# Patient Record
Sex: Male | Born: 1970 | Race: White | Hispanic: No | Marital: Single | Smoking: Never smoker
Health system: Southern US, Community
[De-identification: ages and names within clinical notes are randomized; demographics above are authoritative.]

---

## 1998-09-12 ENCOUNTER — Ambulatory Visit (HOSPITAL_COMMUNITY): Admission: RE | Admit: 1998-09-12 | Discharge: 1998-09-12 | Payer: Self-pay | Admitting: Emergency Medicine

## 2004-10-15 ENCOUNTER — Emergency Department (HOSPITAL_COMMUNITY): Admission: EM | Admit: 2004-10-15 | Discharge: 2004-10-15 | Payer: Self-pay | Admitting: Emergency Medicine

## 2015-10-09 ENCOUNTER — Ambulatory Visit (INDEPENDENT_AMBULATORY_CARE_PROVIDER_SITE_OTHER): Payer: BLUE CROSS/BLUE SHIELD | Admitting: Urgent Care

## 2015-10-09 VITALS — BP 128/80 | HR 60 | Temp 97.8°F | Resp 16 | Ht 72.0 in | Wt 185.0 lb

## 2015-10-09 DIAGNOSIS — S61210A Laceration without foreign body of right index finger without damage to nail, initial encounter: Secondary | ICD-10-CM

## 2015-10-09 DIAGNOSIS — S61219A Laceration without foreign body of unspecified finger without damage to nail, initial encounter: Secondary | ICD-10-CM

## 2015-10-09 NOTE — Progress Notes (Signed)
    MRN: [id number] DOB: June 13, 1970  Subjective:   James Fields is a [age] y.o. male presenting for chief complaint of Finger Injury  Patient was working on his car, withdrew his hand from under his car and cut his right index finger against the car. He cleaned his finger, placed steri strips and came straight to our clinic. He denies loss of ROM, numbness or tingling. His last TDAP was 2014.   Bartlomiej currently has no medications in their medication list. Also has No Known Allergies.  Chao  has no past medical history on file. Also  has no past surgical history on file.  Objective:   Vitals: BP 128/80 mmHg  Pulse 60  Temp(Src) 97.8 F (36.6 C) (Oral)  Resp 16  Ht 6' (1.829 m)  Wt 185 lb (83.915 kg)  BMI 25.08 kg/m2  SpO2 98%  Physical Exam  Constitutional: He is oriented to person, place, and time. He appears well-developed and well-nourished.  Cardiovascular: Normal rate.   Pulmonary/Chest: Effort normal.  Musculoskeletal:       Right hand: He exhibits tenderness (over wound) and laceration. He exhibits normal range of motion, no bony tenderness, normal two-point discrimination, normal capillary refill, no deformity and no swelling. Normal sensation noted. Normal strength noted.       Hands: Neurological: He is alert and oriented to person, place, and time.  Skin: Skin is warm and dry.   PROCEDURE NOTE: laceration repair Verbal consent obtained from patient.  Local anesthesia with 2cc Lidocaine 2% without epinephrine.  Wound explored for tendon, ligament damage. Wound scrubbed with soap and water and rinsed. Wound closed with #4 5-0 Prolene simple interrupted sutures.  Wound cleansed and dressed.  Assessment and Plan :   1. Laceration of finger, initial encounter - Laceration repaired, wound care reviewed. RTC in 7-10 days for suture removal.  Wallis BambergMario Kysha Muralles, PA-C [facility] [phone] 10/09/2015 3:27 PM

## 2015-10-09 NOTE — Patient Instructions (Addendum)
Follow up in 1 week.    IF you received an x-ray today, you will receive an invoice from [facility]. Please contact [facility] at [phone] with questions or concerns regarding your invoice.   IF you received labwork today, you will receive an invoice from [employer]. Please contact [facility] at [phone] with questions or concerns regarding your invoice.   Our billing staff will not be able to assist you with questions regarding bills from these companies.  You will be contacted with the lab results as soon as they are available. The fastest way to get your results is to activate your My Chart account. Instructions are located on the last page of this paperwork. If you have not heard from [place] regarding the results in 2 weeks, please contact this office.    lLaceration Care, Adult A laceration is a cut that goes through all of the layers of the skin and into the tissue that is right under the skin. Some lacerations heal on their own. Others need to be closed with stitches (sutures), staples, skin adhesive strips, or skin glue. Proper laceration care minimizes the risk of infection and helps the laceration to heal better. HOW TO CARE FOR YOUR LACERATION If sutures or staples were used:  Keep the wound clean and dry.  If you were given a bandage (dressing), you should change it at least one time per day or as told by your health care provider. You should also change it if it becomes wet or dirty.  Keep the wound completely dry for the first 24 hours or as told by your health care provider. After that time, you may shower or bathe. However, make sure that the wound is not soaked in water until after the sutures or staples have been removed.  Clean the wound one time each day or as told by your health care provider:  Wash the wound with soap and water.  Rinse the wound with water to remove all soap.  Pat the wound dry with a clean towel. Do  not rub the wound.  After cleaning the wound, apply a thin layer of antibiotic ointmentas told by your health care provider. This will help to prevent infection and keep the dressing from sticking to the wound.  Have the sutures or staples removed as told by your health care provider. If skin adhesive strips were used:  Keep the wound clean and dry.  If you were given a bandage (dressing), you should change it at least one time per day or as told by your health care provider. You should also change it if it becomes dirty or wet.  Do not get the skin adhesive strips wet. You may shower or bathe, but be careful to keep the wound dry.  If the wound gets wet, pat it dry with a clean towel. Do not rub the wound.  Skin adhesive strips fall off on their own. You may trim the strips as the wound heals. Do not remove skin adhesive strips that are still stuck to the wound. They will fall off in time. If skin glue was used:  Try to keep the wound dry, but you may briefly wet it in the shower or bath. Do not soak the wound in water, such as by swimming.  After you have showered or bathed, gently pat the wound dry with a clean towel. Do not rub the wound.  Do not do any activities that will make you sweat heavily until  the skin glue has fallen off on its own.  Do not apply liquid, cream, or ointment medicine to the wound while the skin glue is in place. Using those may loosen the film before the wound has healed.  If you were given a bandage (dressing), you should change it at least one time per day or as told by your health care provider. You should also change it if it becomes dirty or wet.  If a dressing is placed over the wound, be careful not to apply tape directly over the skin glue. Doing that may cause the glue to be pulled off before the wound has healed.  Do not pick at the glue. The skin glue usually remains in place for 5-10 days, then it falls off of the skin. General  Instructions  Take over-the-counter and prescription medicines only as told by your health care provider.  If you were prescribed an antibiotic medicine or ointment, take or apply it as told by your doctor. Do not stop using it even if your condition improves.  To help prevent scarring, make sure to cover your wound with sunscreen whenever you are outside after stitches are removed, after adhesive strips are removed, or when glue remains in place and the wound is healed. Make sure to wear a sunscreen of at least 30 SPF.  Do not scratch or pick at the wound.  Keep all follow-up visits as told by your health care provider. This is important.  Check your wound every day for signs of infection. Watch for:  Redness, swelling, or pain.  Fluid, blood, or pus.  Raise (elevate) the injured area above the level of your heart while you are sitting or lying down, if possible. SEEK MEDICAL CARE IF:  You received a tetanus shot and you have swelling, severe pain, redness, or bleeding at the injection site.  You have a fever.  A wound that was closed breaks open.  You notice a bad smell coming from your wound or your dressing.  You notice something coming out of the wound, such as wood or glass.  Your pain is not controlled with medicine.  You have increased redness, swelling, or pain at the site of your wound.  You have fluid, blood, or pus coming from your wound.  You notice a change in the color of your skin near your wound.  You need to change the dressing frequently due to fluid, blood, or pus draining from the wound.  You develop a new rash.  You develop numbness around the wound. SEEK IMMEDIATE MEDICAL CARE IF:  You develop severe swelling around the wound.  Your pain suddenly increases and is severe.  You develop painful lumps near the wound or on skin that is anywhere on your body.  You have a red streak going away from your wound.  The wound is on your hand or foot  and you cannot properly move a finger or toe.  The wound is on your hand or foot and you notice that your fingers or toes look pale or bluish.   This information is not intended to replace advice given to you by your health care provider. Make sure you discuss any questions you have with your health care provider.   Document Released: 04/26/2005 Document Revised: 09/10/2014 Document Reviewed: 04/22/2014 Elsevier Interactive Patient Education [employer]2016 Elsevier Inc.

## 2016-04-16 DIAGNOSIS — Z23 Encounter for immunization: Secondary | ICD-10-CM | POA: Diagnosis not present

## 2016-05-10 ENCOUNTER — Encounter (HOSPITAL_COMMUNITY): Payer: Self-pay | Admitting: Emergency Medicine

## 2016-05-10 ENCOUNTER — Ambulatory Visit (HOSPITAL_COMMUNITY)
Admission: EM | Admit: 2016-05-10 | Discharge: 2016-05-10 | Disposition: A | Discharge status: Home / Self Care | Payer: BLUE CROSS/BLUE SHIELD | Attending: Family Medicine | Admitting: Family Medicine

## 2016-05-10 DIAGNOSIS — S61217A Laceration without foreign body of left little finger without damage to nail, initial encounter: Secondary | ICD-10-CM

## 2016-05-10 MED ORDER — LIDOCAINE HCL (PF) 2 % IJ SOLN
INTRAMUSCULAR | Status: AC
  Filled 2016-05-10: qty 2

## 2016-05-10 NOTE — ED Provider Notes (Signed)
[facility]    CSN: [id number] Arrival date & time: 05/10/16  1413     History   Chief Complaint Chief Complaint  Patient presents with  . Laceration    HPI James Fields is a 46 y.o. male.   The history is provided by the patient.  Laceration  Location:  Finger Finger laceration location:  R little finger Depth:  Cutaneous Quality: jagged   Bleeding: controlled   Time since incident:  1 hour Laceration mechanism:  Broken glass Pain details:    Quality:  Sharp   Severity:  No pain   Progression:  Unchanged Foreign body present:  No foreign bodies Relieved by:  Nothing Ineffective treatments:  None tried Tetanus status:  Up to date   History reviewed. No pertinent past medical history.  There are no active problems to display for this patient.   History reviewed. No pertinent surgical history.     Home Medications    Prior to Admission medications   Not on File    Family History No family history on file.  Social History Social History  Substance Use Topics  . Smoking status: Never Smoker  . Smokeless tobacco: Never Used  . Alcohol use 0.0 oz/week     Comment: 4-6 per week     Allergies   Patient has no known allergies.   Review of Systems Review of Systems  Skin: Positive for wound.  All other systems reviewed and are negative.    Physical Exam Triage Vital Signs ED Triage Vitals  Enc Vitals Group     BP 05/10/16 1508 135/79     Pulse Rate 05/10/16 1508 (!) 55     Resp 05/10/16 1508 16     Temp 05/10/16 1508 98.6 F (37 C)     Temp Source 05/10/16 1508 Oral     SpO2 05/10/16 1508 100 %     Weight 05/10/16 1508 185 lb (83.9 kg)     Height 05/10/16 1508 6' (1.829 m)     Head Circumference --      Peak Flow --      Pain Score 05/10/16 1510 0     Pain Loc --      Pain Edu? --      Excl. in GC? --    No data found.   Updated Vital Signs BP 135/79   Pulse (!) 55   Temp 98.6 F (37 C) (Oral)   Resp 16   Ht  6' (1.829 m)   Wt 185 lb (83.9 kg)   SpO2 100%   BMI 25.09 kg/m   Visual Acuity Right Eye Distance:   Left Eye Distance:   Bilateral Distance:    Right Eye Near:   Left Eye Near:    Bilateral Near:     Physical Exam  Constitutional: He appears well-developed and well-nourished.  Musculoskeletal: He exhibits no tenderness.  Lac to ulnar side of 5th finger nvt intact.  Skin: Skin is warm and dry.  Nursing note and vitals reviewed.    UC Treatments / Results  Labs (all labs ordered are listed, but only abnormal results are displayed) Labs Reviewed - No data to display  EKG  EKG Interpretation None       Radiology No results found.  Procedures .Marland KitchenLaceration Repair Date/Time: 05/10/2016 4:02 PM Performed by: Linna Hoff Authorized by: Bradd Canary D   Consent:    Consent obtained:  Verbal   Consent given by:  Patient  Risks discussed:  Infection Anesthesia (see MAR for exact dosages):    Anesthesia method:  Local infiltration   Local anesthetic:  Lidocaine 2% w/o epi Laceration details:    Location:  Finger   Finger location:  R small finger   Length (cm):  2.5   Depth (mm):  1 Repair type:    Repair type:  Simple Pre-procedure details:    Preparation:  Patient was prepped and draped in usual sterile fashion Exploration:    Wound exploration: wound explored through full range of motion     Contaminated: no   Treatment:    Area cleansed with:  Betadine   Amount of cleaning:  Standard   Irrigation solution:  Sterile saline   Visualized foreign bodies/material removed: no   Skin repair:    Repair method:  Sutures   Suture size:  5-0   Suture material:  Prolene   Suture technique:  Simple interrupted   Number of sutures:  4 Approximation:    Approximation:  Close   Vermilion border: well-aligned   Post-procedure details:    Dressing:  Antibiotic ointment and sterile dressing   Patient tolerance of procedure:  Tolerated well, no immediate  complications    (including critical care time)  Medications Ordered in UC Medications - No data to display   Initial Impression / Assessment and Plan / UC Course  I have reviewed the triage vital signs and the nursing notes.  Pertinent labs & imaging results that were available during my care of the patient were reviewed by me and considered in my medical decision making (see chart for details).  Clinical Course       Final Clinical Impressions(s) / UC Diagnoses   Final diagnoses:  None    New Prescriptions New Prescriptions   No medications on file     Linna HoffJames D Rheba Diamond, MD 05/25/16 2052

## 2016-05-10 NOTE — Discharge Instructions (Signed)
Keep clean and dry, cover, sutures out in 10 days. Return if any problems.

## 2016-05-10 NOTE — ED Triage Notes (Signed)
PT cut right fifth finger on glasswear while doing dishes one hour ago. Bleeding controlled with pressure

## 2016-05-10 NOTE — ED Notes (Signed)
Patient's wound covered with nonadherent gauze and coban wrap per provider's verbal order.

## 2016-07-16 DIAGNOSIS — H52223 Regular astigmatism, bilateral: Secondary | ICD-10-CM | POA: Diagnosis not present

## 2016-07-16 DIAGNOSIS — H5203 Hypermetropia, bilateral: Secondary | ICD-10-CM | POA: Diagnosis not present

## 2017-02-19 DIAGNOSIS — Z23 Encounter for immunization: Secondary | ICD-10-CM | POA: Diagnosis not present

## 2017-04-20 DIAGNOSIS — Z23 Encounter for immunization: Secondary | ICD-10-CM | POA: Diagnosis not present

## 2017-04-28 DIAGNOSIS — L570 Actinic keratosis: Secondary | ICD-10-CM | POA: Diagnosis not present

## 2017-06-08 DIAGNOSIS — L570 Actinic keratosis: Secondary | ICD-10-CM | POA: Diagnosis not present

## 2017-11-15 DIAGNOSIS — M79645 Pain in left finger(s): Secondary | ICD-10-CM | POA: Diagnosis not present

## 2017-11-15 DIAGNOSIS — Z6826 Body mass index (BMI) 26.0-26.9, adult: Secondary | ICD-10-CM | POA: Diagnosis not present

## 2017-11-15 DIAGNOSIS — M778 Other enthesopathies, not elsewhere classified: Secondary | ICD-10-CM | POA: Diagnosis not present

## 2018-02-13 DIAGNOSIS — Z23 Encounter for immunization: Secondary | ICD-10-CM | POA: Diagnosis not present

## 2018-03-17 ENCOUNTER — Ambulatory Visit (INDEPENDENT_AMBULATORY_CARE_PROVIDER_SITE_OTHER): Payer: BLUE CROSS/BLUE SHIELD | Admitting: Family Medicine

## 2018-03-17 DIAGNOSIS — Z111 Encounter for screening for respiratory tuberculosis: Secondary | ICD-10-CM

## 2018-03-17 NOTE — Patient Instructions (Addendum)
    Tuberculosis Risk Questionnaire  1. No Were you born outside the [place] in one of the following parts of the world: [place], [place], [place], [place] or [place]?    2. No Have you traveled outside the [place] and lived for more than one month in one of the following parts of the world: [place], [place], [place], [place] or [place]?    3. No Do you have a compromised immune system such as from any of the following conditions:HIV/AIDS, organ or bone marrow transplantation, diabetes, immunosuppressive medicines (e.g. Prednisone, Remicaide), leukemia, lymphoma, cancer of the head or neck, gastrectomy or jejunal bypass, end-stage renal disease (on dialysis), or silicosis?     4. No Have you ever or do you plan on working in: a residential care center, a health care facility, a jail or prison or homeless shelter?    5. No Have you ever: injected illegal drugs, used crack cocaine, lived in a homeless shelter  or been in jail or prison?     6. No Have you ever been exposed to anyone with infectious tuberculosis?  7. No Have you ever had a BCG vaccine? (BCG is a vaccine for tuberculosis  (TB) used in OTHER countries, NOT in the [place]).  8. No Have you ever been advised by a health care provider NOT to have a TB skin test?  9. No Have you ever had a POSITIVE TB skin test?  IF SO, when? n/a  IF SO, were you treated with INH? n/a  IF SO, where? n/a  Tuberculosis Symptom Questionnaire  Do you currently have any of the following symptoms? no  1. No Unexplained cough lasting more than 3 weeks? no 2. No Unexplained fever lasting more than 3 weeks. no  3. No Night Sweats (sweating that leaves the bedclothes and sheets wet) no    4. No Shortness of Breath no  5. No Chest Pain no  6. No Unintentional weight loss    7. No Unexplained fatigue (very tired for no reason)

## 2018-03-17 NOTE — Progress Notes (Signed)
  Tuberculosis Risk Questionnaire  1. No Were you born outside the [place] in one of the following parts of the world: [place], [place], [place], [place] or [place]?    2. No Have you traveled outside the [place] and lived for more than one month in one of the following parts of the world: [place], [place], [place], [place] or [place]?    3. No Do you have a compromised immune system such as from any of the following conditions:HIV/AIDS, organ or bone marrow transplantation, diabetes, immunosuppressive medicines (e.g. Prednisone, Remicaide), leukemia, lymphoma, cancer of the head or neck, gastrectomy or jejunal bypass, end-stage renal disease (on dialysis), or silicosis?     4. No Have you ever or do you plan on working in: a residential care center, a health care facility, a jail or prison or homeless shelter?    5. No Have you ever: injected illegal drugs, used crack cocaine, lived in a homeless shelter  or been in jail or prison?     6. No Have you ever been exposed to anyone with infectious tuberculosis?  7. No Have you ever had a BCG vaccine? (BCG is a vaccine for tuberculosis  (TB) used in OTHER countries, NOT in the [place]).  8. No Have you ever been advised by a health care provider NOT to have a TB skin test?  9. No Have you ever had a POSITIVE TB skin test?   Tuberculosis Symptom Questionnaire  Do you currently have any of the following symptoms?  1. No Unexplained cough lasting more than 3 weeks?   2. No Unexplained fever lasting more than 3 weeks.   3. No Night Sweats (sweating that leaves the bedclothes and sheets wet)     4. No Shortness of Breath   5. No Chest Pain   6. No Unintentional weight loss    7. No Unexplained fatigue (very tired for no reason)

## 2018-03-21 LAB — QUANTIFERON-TB GOLD PLUS
QUANTIFERON TB2 AG VALUE: 0.07 [IU]/mL
QUANTIFERON-TB GOLD PLUS: NEGATIVE
QuantiFERON Mitogen Value: >10 IU/mL
QuantiFERON Nil Value: 0.09 IU/mL
QuantiFERON TB1 Ag Value: 0.07 IU/mL

## 2018-04-10 DIAGNOSIS — Z0000 Encounter for general adult medical examination without abnormal findings: Secondary | ICD-10-CM | POA: Diagnosis not present

## 2018-04-10 DIAGNOSIS — Z125 Encounter for screening for malignant neoplasm of prostate: Secondary | ICD-10-CM | POA: Diagnosis not present

## 2018-04-17 DIAGNOSIS — Z6827 Body mass index (BMI) 27.0-27.9, adult: Secondary | ICD-10-CM | POA: Diagnosis not present

## 2018-04-17 DIAGNOSIS — Z0000 Encounter for general adult medical examination without abnormal findings: Secondary | ICD-10-CM | POA: Diagnosis not present

## 2018-04-17 DIAGNOSIS — Z1389 Encounter for screening for other disorder: Secondary | ICD-10-CM | POA: Diagnosis not present

## 2018-04-17 DIAGNOSIS — D229 Melanocytic nevi, unspecified: Secondary | ICD-10-CM | POA: Diagnosis not present

## 2018-04-17 DIAGNOSIS — R0683 Snoring: Secondary | ICD-10-CM | POA: Diagnosis not present

## 2018-04-17 DIAGNOSIS — M79645 Pain in left finger(s): Secondary | ICD-10-CM | POA: Diagnosis not present

## 2018-04-18 ENCOUNTER — Other Ambulatory Visit: Payer: Self-pay | Admitting: Internal Medicine

## 2018-04-18 DIAGNOSIS — Z0000 Encounter for general adult medical examination without abnormal findings: Secondary | ICD-10-CM

## 2018-04-18 DIAGNOSIS — Z1212 Encounter for screening for malignant neoplasm of rectum: Secondary | ICD-10-CM | POA: Diagnosis not present

## 2018-05-01 ENCOUNTER — Encounter: Payer: Self-pay | Admitting: Family Medicine

## 2018-05-18 ENCOUNTER — Encounter: Payer: Self-pay | Admitting: Family Medicine

## 2018-05-18 NOTE — Progress Notes (Signed)
Letter sent out 

## 2019-01-04 ENCOUNTER — Telehealth: Payer: BLUE CROSS/BLUE SHIELD | Admitting: Physician Assistant

## 2019-01-04 DIAGNOSIS — Z20822 Contact with and (suspected) exposure to covid-19: Secondary | ICD-10-CM

## 2019-01-04 DIAGNOSIS — Z20828 Contact with and (suspected) exposure to other viral communicable diseases: Secondary | ICD-10-CM

## 2019-01-04 NOTE — Progress Notes (Signed)
E-Visit for Corona Virus Screening   Your current symptoms could be consistent with the coronavirus.  Many health care providers can now test patients at their office but not all are.  [facility] has multiple testing sites. For information on our COVID testing locations and hours go to [email or web address]  Please quarantine yourself while awaiting your test results.  We are enrolling you in our MyChart Home [facility] for COVID19 . Daily you will receive a questionnaire within the MyChart website. Our COVID 19 response team willl be monitoriing your responses daily.    COVID-19 is a respiratory illness with symptoms that are similar to the flu. Symptoms are typically mild to moderate, but there have been cases of severe illness and death due to the virus. The following symptoms may appear 2-14 days after exposure: . Fever . Cough . Shortness of breath or difficulty breathing . Chills . Repeated shaking with chills . Muscle pain . Headache . Sore throat . New loss of taste or smell . Fatigue . Congestion or runny nose . Nausea or vomiting . Diarrhea  It is vitally important that if you feel that you have an infection such as this virus or any other virus that you stay home and away from places where you may spread it to others.  You should self-quarantine for 14 days if you have symptoms that could potentially be coronavirus or have been in close contact a with a person diagnosed with COVID-19 within the last 2 weeks. You should avoid contact with people age 48 and older.   You should wear a mask or cloth face covering over your nose and mouth if you must be around other people or animals, including pets (even at home). Try to stay at least 6 feet away from other people. This will protect the people around you.  If you have a cough, you may use cough suppressants such as Delsym and Robitussin.  If you have glaucoma or high blood pressure, you can also use  Coricidin HBP.   You may also take acetaminophen (Tylenol) as needed for fever.   Reduce your risk of any infection by using the same precautions used for avoiding the common cold or flu:  Marland Kitchen. Wash your hands often with soap and warm water for at least 20 seconds.  If soap and water are not readily available, use an alcohol-based hand sanitizer with at least 60% alcohol.  . If coughing or sneezing, cover your mouth and nose by coughing or sneezing into the elbow areas of your shirt or coat, into a tissue or into your sleeve (not your hands). . Avoid shaking hands with others and consider head nods or verbal greetings only. . Avoid touching your eyes, nose, or mouth with unwashed hands.  . Avoid close contact with people who are sick. . Avoid places or events with large numbers of people in one location, like concerts or sporting events. . Carefully consider travel plans you have or are making. . If you are planning any travel outside or inside the [place], visit the CDC's Travelers' Health webpage for the latest health notices. . If you have some symptoms but not all symptoms, continue to monitor at home and seek medical attention if your symptoms worsen. . If you are having a medical emergency, call 911.  HOME CARE . Only take medications as instructed by your medical team. . Drink plenty of fluids and get plenty of rest. . A steam or ultrasonic humidifier can help if you  have congestion.   GET HELP RIGHT AWAY IF YOU HAVE EMERGENCY WARNING SIGNS** FOR COVID-19. If you or someone is showing any of these signs seek emergency medical care immediately. Call 911 or proceed to your closest emergency facility if: . You develop worsening high fever. . Trouble breathing . Bluish lips or face . Persistent pain or pressure in the chest . New confusion . Inability to wake or stay awake . You cough up blood. . Your symptoms become more severe  **This list is not all possible symptoms. Contact your  medical provider for any symptoms that are sever or concerning to you.   MAKE SURE YOU   Understand these instructions.  Will watch your condition.  Will get help right away if you are not doing well or get worse.  Your e-visit answers were reviewed by a board certified advanced clinical practitioner to complete your personal care plan.  Depending on the condition, your plan could have included both over the counter or prescription medications.  If there is a problem please reply once you have received a response from your provider.  Your safety is important to [place].  If you have drug allergies check your prescription carefully.    You can use MyChart to ask questions about today's visit, request a non-urgent call back, or ask for a work or school excuse for 24 hours related to this e-Visit. If it has been greater than 24 hours you will need to follow up with your provider, or enter a new e-Visit to address those concerns. You will get an e-mail in the next two days asking about your experience.  I hope that your e-visit has been valuable and will speed your recovery. Thank you for using e-visits.  I have spent 5 minutes in review of e-visit questionnaire, review and updating patient chart, medical decision making and response to patient.    Tenna Delaine, PA-C

## 2019-01-05 DIAGNOSIS — Z20828 Contact with and (suspected) exposure to other viral communicable diseases: Secondary | ICD-10-CM | POA: Diagnosis not present

## 2019-01-08 DIAGNOSIS — Z20828 Contact with and (suspected) exposure to other viral communicable diseases: Secondary | ICD-10-CM | POA: Diagnosis not present

## 2019-02-16 DIAGNOSIS — Z23 Encounter for immunization: Secondary | ICD-10-CM | POA: Diagnosis not present

## 2019-02-28 DIAGNOSIS — M24811 Other specific joint derangements of right shoulder, not elsewhere classified: Secondary | ICD-10-CM | POA: Diagnosis not present

## 2019-04-23 DIAGNOSIS — E663 Overweight: Secondary | ICD-10-CM | POA: Diagnosis not present

## 2019-04-23 DIAGNOSIS — R251 Tremor, unspecified: Secondary | ICD-10-CM | POA: Diagnosis not present

## 2019-04-23 DIAGNOSIS — R0683 Snoring: Secondary | ICD-10-CM | POA: Diagnosis not present

## 2019-04-23 DIAGNOSIS — Z0000 Encounter for general adult medical examination without abnormal findings: Secondary | ICD-10-CM | POA: Diagnosis not present

## 2020-01-22 DIAGNOSIS — Z111 Encounter for screening for respiratory tuberculosis: Secondary | ICD-10-CM | POA: Diagnosis not present

## 2020-05-28 DIAGNOSIS — M67912 Unspecified disorder of synovium and tendon, left shoulder: Secondary | ICD-10-CM | POA: Diagnosis not present

## 2020-05-28 DIAGNOSIS — M24811 Other specific joint derangements of right shoulder, not elsewhere classified: Secondary | ICD-10-CM | POA: Diagnosis not present

## 2020-06-02 DIAGNOSIS — Z1159 Encounter for screening for other viral diseases: Secondary | ICD-10-CM | POA: Diagnosis not present

## 2020-06-02 DIAGNOSIS — Z1152 Encounter for screening for COVID-19: Secondary | ICD-10-CM | POA: Diagnosis not present

## 2020-07-08 DIAGNOSIS — L570 Actinic keratosis: Secondary | ICD-10-CM | POA: Diagnosis not present

## 2020-07-09 ENCOUNTER — Other Ambulatory Visit: Payer: Self-pay | Admitting: Orthopedic Surgery

## 2020-07-09 DIAGNOSIS — M67912 Unspecified disorder of synovium and tendon, left shoulder: Secondary | ICD-10-CM

## 2020-07-29 ENCOUNTER — Ambulatory Visit
Admission: RE | Admit: 2020-07-29 | Discharge: 2020-07-29 | Disposition: A | Discharge status: Home / Self Care | Payer: BLUE CROSS/BLUE SHIELD | Source: Ambulatory Visit | Attending: Orthopedic Surgery | Admitting: Orthopedic Surgery

## 2020-07-29 ENCOUNTER — Other Ambulatory Visit: Payer: Self-pay

## 2020-07-29 DIAGNOSIS — M67912 Unspecified disorder of synovium and tendon, left shoulder: Secondary | ICD-10-CM

## 2020-07-29 DIAGNOSIS — M7502 Adhesive capsulitis of left shoulder: Secondary | ICD-10-CM | POA: Diagnosis not present

## 2020-07-29 DIAGNOSIS — R600 Localized edema: Secondary | ICD-10-CM | POA: Diagnosis not present

## 2020-08-04 DIAGNOSIS — L570 Actinic keratosis: Secondary | ICD-10-CM | POA: Diagnosis not present

## 2020-08-06 DIAGNOSIS — M7502 Adhesive capsulitis of left shoulder: Secondary | ICD-10-CM | POA: Diagnosis not present

## 2020-08-15 DIAGNOSIS — M6281 Muscle weakness (generalized): Secondary | ICD-10-CM | POA: Diagnosis not present

## 2020-08-15 DIAGNOSIS — M25612 Stiffness of left shoulder, not elsewhere classified: Secondary | ICD-10-CM | POA: Diagnosis not present

## 2020-09-01 DIAGNOSIS — M24812 Other specific joint derangements of left shoulder, not elsewhere classified: Secondary | ICD-10-CM | POA: Diagnosis not present

## 2020-09-09 DIAGNOSIS — M75112 Incomplete rotator cuff tear or rupture of left shoulder, not specified as traumatic: Secondary | ICD-10-CM | POA: Diagnosis not present

## 2020-09-09 DIAGNOSIS — M7502 Adhesive capsulitis of left shoulder: Secondary | ICD-10-CM | POA: Diagnosis not present

## 2020-09-09 DIAGNOSIS — M24112 Other articular cartilage disorders, left shoulder: Secondary | ICD-10-CM | POA: Diagnosis not present

## 2020-09-09 DIAGNOSIS — G8918 Other acute postprocedural pain: Secondary | ICD-10-CM | POA: Diagnosis not present

## 2020-09-10 DIAGNOSIS — M25612 Stiffness of left shoulder, not elsewhere classified: Secondary | ICD-10-CM | POA: Diagnosis not present

## 2020-09-11 DIAGNOSIS — M25612 Stiffness of left shoulder, not elsewhere classified: Secondary | ICD-10-CM | POA: Diagnosis not present

## 2020-09-12 DIAGNOSIS — M25612 Stiffness of left shoulder, not elsewhere classified: Secondary | ICD-10-CM | POA: Diagnosis not present

## 2020-09-24 DIAGNOSIS — M25612 Stiffness of left shoulder, not elsewhere classified: Secondary | ICD-10-CM | POA: Diagnosis not present

## 2020-09-26 DIAGNOSIS — M25612 Stiffness of left shoulder, not elsewhere classified: Secondary | ICD-10-CM | POA: Diagnosis not present

## 2020-10-03 DIAGNOSIS — M25612 Stiffness of left shoulder, not elsewhere classified: Secondary | ICD-10-CM | POA: Diagnosis not present

## 2020-10-08 DIAGNOSIS — M25612 Stiffness of left shoulder, not elsewhere classified: Secondary | ICD-10-CM | POA: Diagnosis not present

## 2020-12-09 DIAGNOSIS — H1032 Unspecified acute conjunctivitis, left eye: Secondary | ICD-10-CM | POA: Diagnosis not present

## 2020-12-09 DIAGNOSIS — H53001 Unspecified amblyopia, right eye: Secondary | ICD-10-CM | POA: Diagnosis not present

## 2021-07-21 DIAGNOSIS — L570 Actinic keratosis: Secondary | ICD-10-CM | POA: Diagnosis not present

## 2021-07-24 DIAGNOSIS — L570 Actinic keratosis: Secondary | ICD-10-CM | POA: Diagnosis not present

## 2022-01-10 DIAGNOSIS — H109 Unspecified conjunctivitis: Secondary | ICD-10-CM | POA: Diagnosis not present

## 2022-01-17 IMAGING — MR MR SHOULDER*L* W/O CM
4 of 5 series · 21 of 40 positions shown · non-contrast
Comparison: None.

CLINICAL DATA: Left shoulder pain and limited range of motion for 2
months

EXAM:
MRI OF THE LEFT SHOULDER WITHOUT CONTRAST
TECHNIQUE: Multiplanar, multisequence MR imaging of the shoulder was performed.
No intravenous contrast was administered.

[Series 6: T2 fat-sat · axial · left · 3.0mm · 0.47mm/px · z∈[-48,+53]mm · 8 of 27 slices shown (1 of 3)]
[im 1/27]
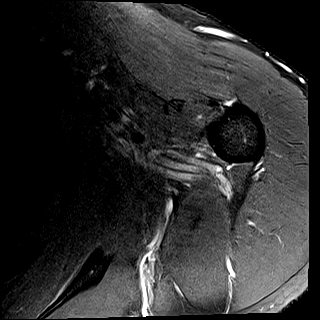
[im 3/27]
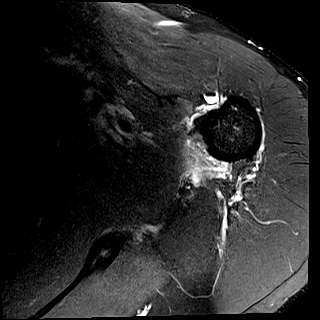
[im 9/27]
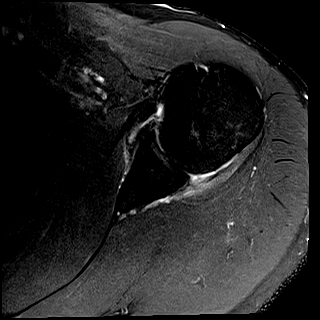
[im 12/27]
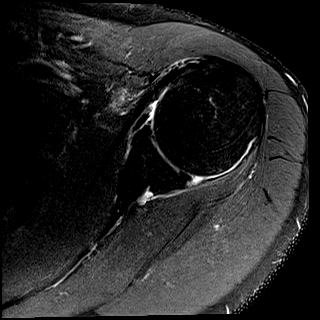
[im 15/27]
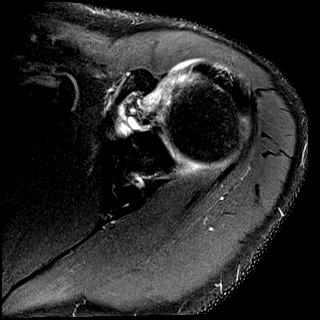
[im 18/27]
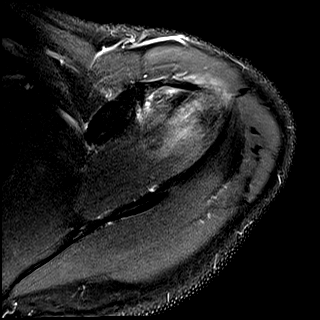
[im 24/27]
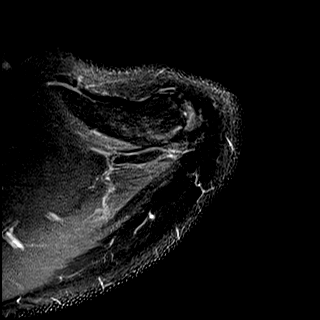
[im 27/27]
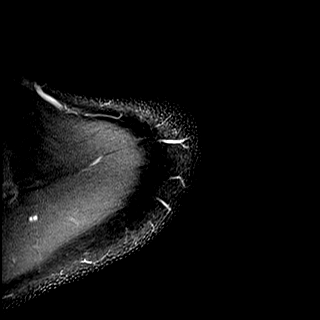

[Series 7: T2 fat-sat · oblique · left · 4.0mm · 0.22mm/px · 3 of 21 slices shown (2 of 3)]
[im 4/21]
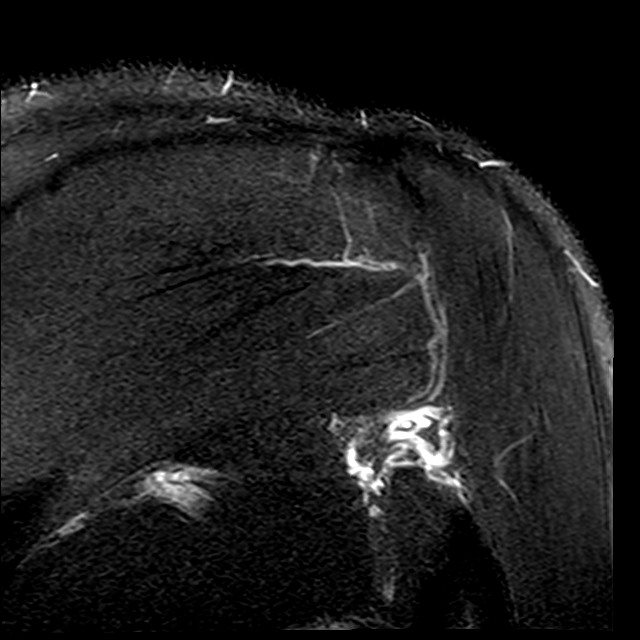
[im 11/21]
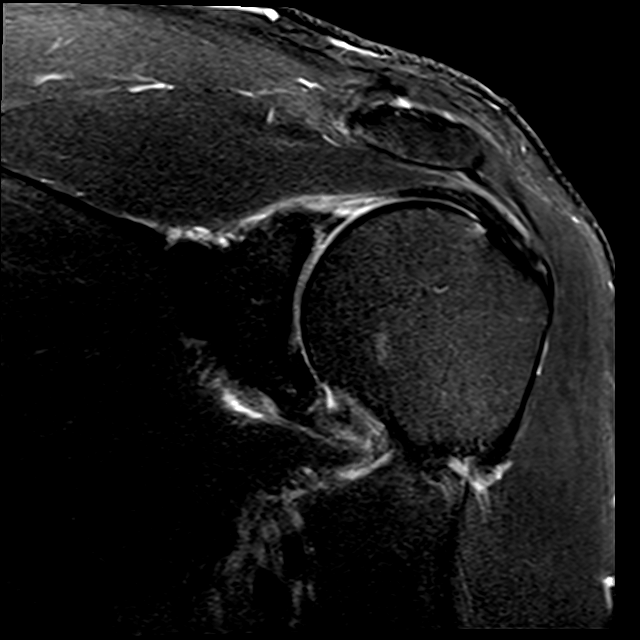
[im 17/21]
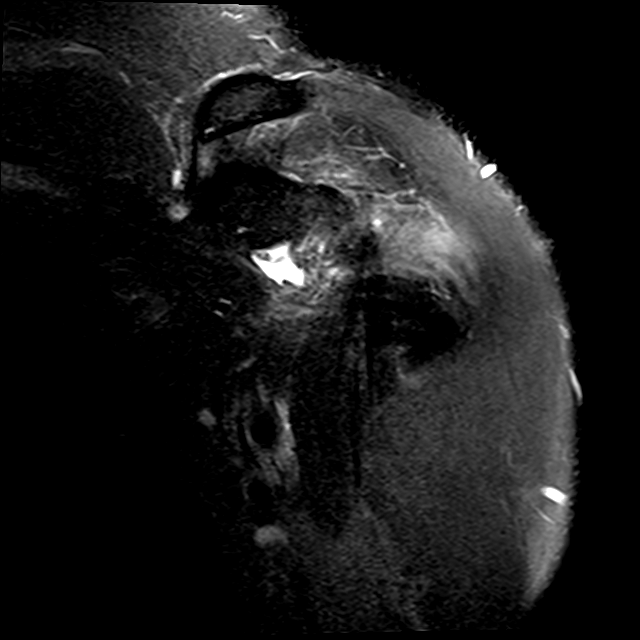

[Series 8: T2 fat-sat · oblique · left · 4.0mm · 0.44mm/px · 3 of 23 slices shown (3 of 3)]
[im 4/23]
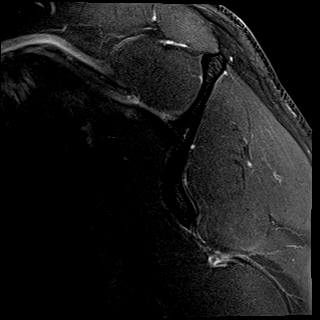
[im 13/23]
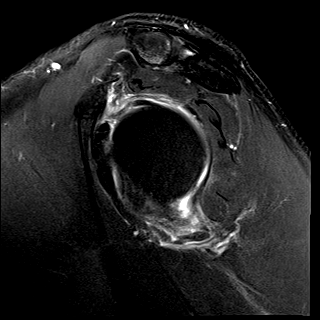
[im 19/23]
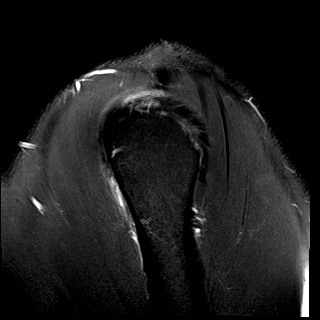

[Series 9: PD · oblique · left · 4.0mm · 0.22mm/px · 7 of 21 slices shown]
[im 1/21]
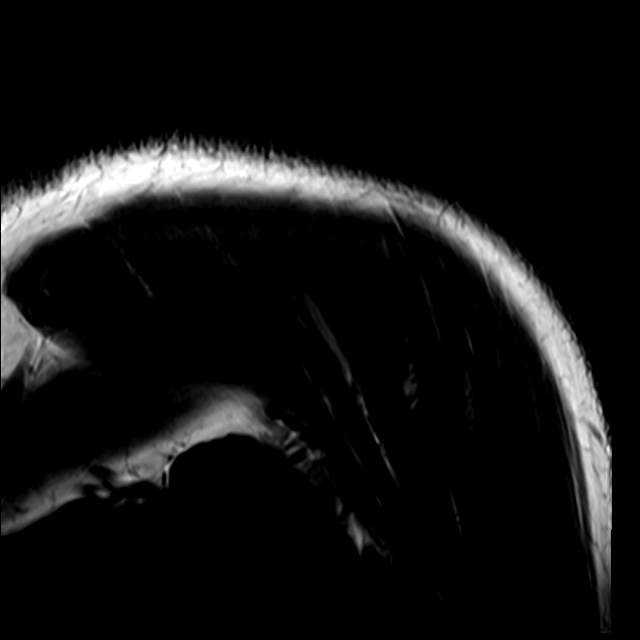
[im 4/21]
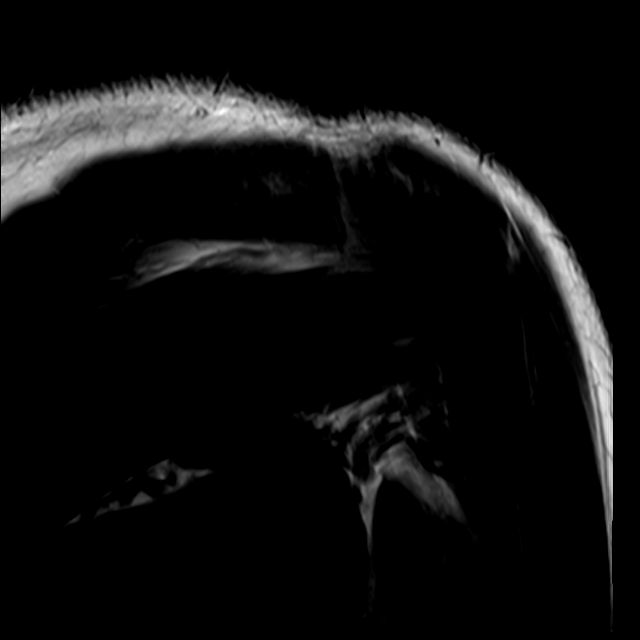
[im 7/21]
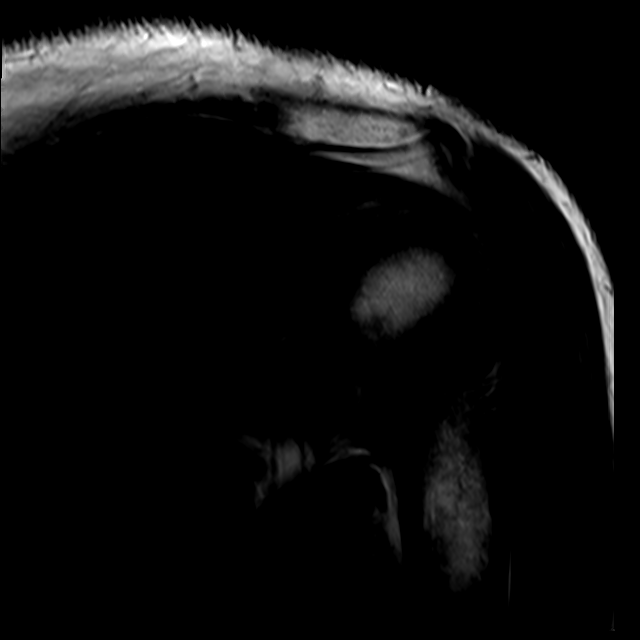
[im 11/21]
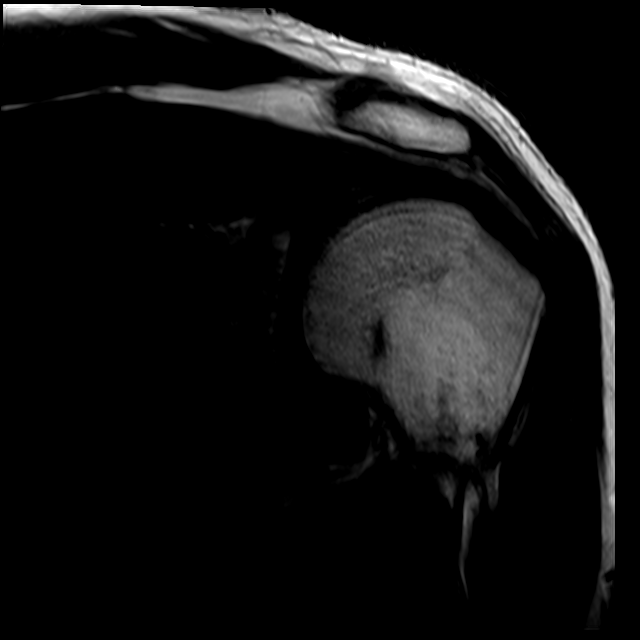
[im 14/21]
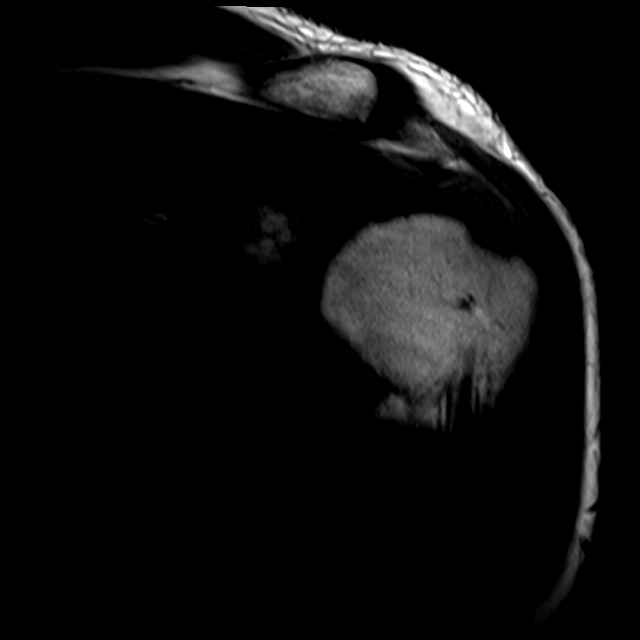
[im 17/21]
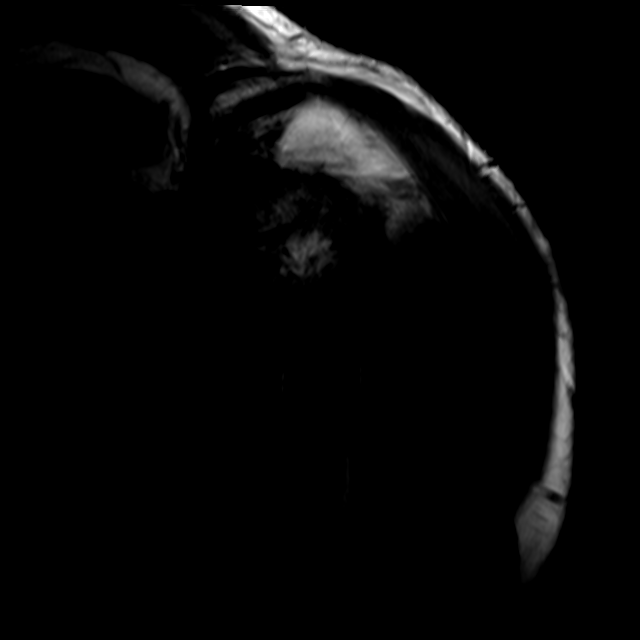
[im 21/21]
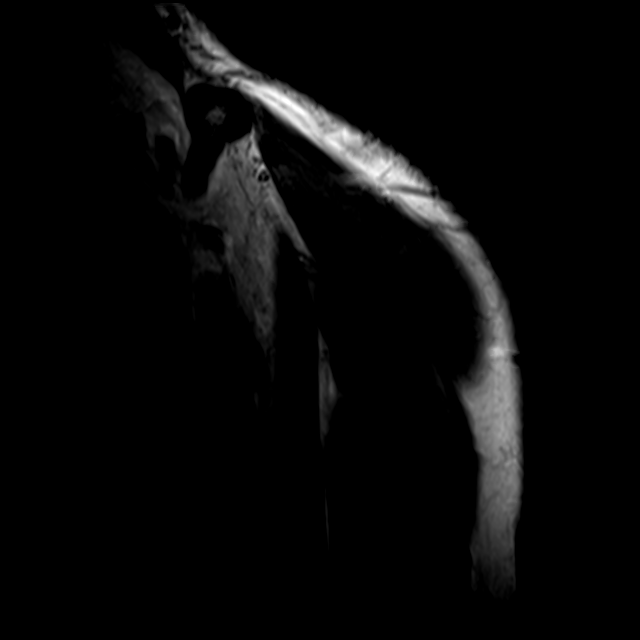

[21 of 40 positions shown; findings below may reference images not displayed]

FINDINGS: Rotator cuff: Mild tendinosis of the supraspinatus tendon without
evidence of tear. Infraspinatus, subscapularis, and teres minor
tendons intact.

Muscles: Preserved bulk and signal intensity of the rotator cuff
musculature without edema, atrophy, or fatty infiltration.

Biceps long head:  Intact.

Acromioclavicular Joint: No significant arthropathy of the AC joint.
Trace subacromial-subdeltoid bursal fluid.

Glenohumeral Joint: No joint effusion. No chondral defect.

Labrum: Heterogeneity of the posterosuperior labrum which may
reflect degeneration and/or tearing. Labral evaluation is limited in
the absence of intra-articular fluid or contrast. No paralabral
cyst.

Bones:  No marrow abnormality, fracture or dislocation.

Other: Thickening of the inferior glenohumeral ligament with
pericapsular edema. Soft tissue edema present within the rotator
interval.
IMPRESSION: 1. Findings which can be seen in the clinical setting of adhesive
capsulitis.
2. Mild supraspinatus tendinosis without evidence of tear.
3. Heterogeneity of the posterosuperior labrum which may reflect
degeneration and/or tear.

## 2022-09-08 DIAGNOSIS — Z23 Encounter for immunization: Secondary | ICD-10-CM | POA: Diagnosis not present

## 2022-09-21 ENCOUNTER — Other Ambulatory Visit: Payer: Self-pay | Admitting: Internal Medicine

## 2022-09-21 DIAGNOSIS — Z125 Encounter for screening for malignant neoplasm of prostate: Secondary | ICD-10-CM | POA: Diagnosis not present

## 2022-09-21 DIAGNOSIS — Z1331 Encounter for screening for depression: Secondary | ICD-10-CM | POA: Diagnosis not present

## 2022-09-21 DIAGNOSIS — Z0000 Encounter for general adult medical examination without abnormal findings: Secondary | ICD-10-CM

## 2022-09-21 DIAGNOSIS — R251 Tremor, unspecified: Secondary | ICD-10-CM | POA: Diagnosis not present

## 2022-09-21 DIAGNOSIS — R5383 Other fatigue: Secondary | ICD-10-CM | POA: Diagnosis not present

## 2022-12-14 ENCOUNTER — Encounter: Payer: BC Managed Care – PPO | Admitting: Gastroenterology

## 2023-01-12 ENCOUNTER — Telehealth: Payer: Self-pay | Admitting: *Deleted

## 2023-01-12 ENCOUNTER — Telehealth: Payer: Self-pay

## 2023-01-12 NOTE — Telephone Encounter (Signed)
Attempt to reach pt for pre-visit. LM with call back #. Will attempt to reach again in 5 min due to no other # listed in profile 2nd attempt LM with call back # and instructions to call # givn and reschedule pre-visit with RN or procedure will be canceled. No show letters to be sent by My Chart and mail

## 2023-01-12 NOTE — Telephone Encounter (Signed)
Patient No Show pre visit.  No call back by 5pm to reschedule PV, so PV and colonoscopy were cancelled.

## 2023-01-31 ENCOUNTER — Encounter: Payer: BC Managed Care – PPO | Admitting: Gastroenterology

## 2023-06-14 DIAGNOSIS — C44321 Squamous cell carcinoma of skin of nose: Secondary | ICD-10-CM | POA: Diagnosis not present

## 2023-06-14 DIAGNOSIS — L570 Actinic keratosis: Secondary | ICD-10-CM | POA: Diagnosis not present

## 2023-06-14 DIAGNOSIS — D485 Neoplasm of uncertain behavior of skin: Secondary | ICD-10-CM | POA: Diagnosis not present

## 2023-07-06 DIAGNOSIS — C44321 Squamous cell carcinoma of skin of nose: Secondary | ICD-10-CM | POA: Diagnosis not present

## 2023-09-26 DIAGNOSIS — Z1212 Encounter for screening for malignant neoplasm of rectum: Secondary | ICD-10-CM | POA: Diagnosis not present

## 2023-09-27 DIAGNOSIS — R5383 Other fatigue: Secondary | ICD-10-CM | POA: Diagnosis not present

## 2023-09-27 DIAGNOSIS — Z125 Encounter for screening for malignant neoplasm of prostate: Secondary | ICD-10-CM | POA: Diagnosis not present

## 2023-10-07 DIAGNOSIS — Z1331 Encounter for screening for depression: Secondary | ICD-10-CM | POA: Diagnosis not present

## 2023-10-07 DIAGNOSIS — R82998 Other abnormal findings in urine: Secondary | ICD-10-CM | POA: Diagnosis not present

## 2023-10-07 DIAGNOSIS — Z1339 Encounter for screening examination for other mental health and behavioral disorders: Secondary | ICD-10-CM | POA: Diagnosis not present

## 2023-10-07 DIAGNOSIS — Z0000 Encounter for general adult medical examination without abnormal findings: Secondary | ICD-10-CM | POA: Diagnosis not present

## 2023-10-07 DIAGNOSIS — M7502 Adhesive capsulitis of left shoulder: Secondary | ICD-10-CM | POA: Diagnosis not present
